# Patient Record
Sex: Female | Born: 1940 | Race: White | Hispanic: No | Marital: Married | Smoking: Never smoker
Health system: Southern US, Community
[De-identification: ages and names within clinical notes are randomized; demographics above are authoritative.]

## PROBLEM LIST (undated history)

## (undated) DIAGNOSIS — K5790 Diverticulosis of intestine, part unspecified, without perforation or abscess without bleeding: Secondary | ICD-10-CM

## (undated) DIAGNOSIS — M1990 Unspecified osteoarthritis, unspecified site: Secondary | ICD-10-CM

## (undated) DIAGNOSIS — I499 Cardiac arrhythmia, unspecified: Secondary | ICD-10-CM

## (undated) DIAGNOSIS — IMO0001 Reserved for inherently not codable concepts without codable children: Secondary | ICD-10-CM

## (undated) DIAGNOSIS — H9190 Unspecified hearing loss, unspecified ear: Secondary | ICD-10-CM

## (undated) DIAGNOSIS — K602 Anal fissure, unspecified: Secondary | ICD-10-CM

## (undated) DIAGNOSIS — Z9289 Personal history of other medical treatment: Secondary | ICD-10-CM

## (undated) DIAGNOSIS — R600 Localized edema: Secondary | ICD-10-CM

## (undated) DIAGNOSIS — D649 Anemia, unspecified: Secondary | ICD-10-CM

## (undated) DIAGNOSIS — I10 Essential (primary) hypertension: Secondary | ICD-10-CM

## (undated) DIAGNOSIS — E119 Type 2 diabetes mellitus without complications: Secondary | ICD-10-CM

## (undated) DIAGNOSIS — T884XXA Failed or difficult intubation, initial encounter: Secondary | ICD-10-CM

## (undated) DIAGNOSIS — E785 Hyperlipidemia, unspecified: Secondary | ICD-10-CM

## (undated) DIAGNOSIS — K7460 Unspecified cirrhosis of liver: Secondary | ICD-10-CM

## (undated) HISTORY — DX: Unspecified cirrhosis of liver: K74.60

## (undated) HISTORY — DX: Anal fissure, unspecified: K60.2

## (undated) HISTORY — PX: CATARACT EXTRACTION, BILATERAL: SHX1313

## (undated) HISTORY — PX: ABDOMINAL HYSTERECTOMY: SHX81

## (undated) HISTORY — PX: TOTAL HIP ARTHROPLASTY: SHX124

## (undated) HISTORY — PX: REPLACEMENT TOTAL KNEE: SUR1224

## (undated) HISTORY — DX: Failed or difficult intubation, initial encounter: T88.4XXA

## (undated) HISTORY — DX: Hyperlipidemia, unspecified: E78.5

## (undated) HISTORY — DX: Diverticulosis of intestine, part unspecified, without perforation or abscess without bleeding: K57.90

---

## 2014-05-01 ENCOUNTER — Encounter (HOSPITAL_COMMUNITY): Payer: Self-pay | Admitting: *Deleted

## 2014-05-01 ENCOUNTER — Inpatient Hospital Stay (HOSPITAL_COMMUNITY)
Admission: EM | Admit: 2014-05-01 | Discharge: 2014-05-01 | DRG: 812 | Disposition: A | Discharge status: Home / Self Care | Payer: Medicare Other | Source: Other Acute Inpatient Hospital | Attending: Internal Medicine | Admitting: Internal Medicine

## 2014-05-01 DIAGNOSIS — D649 Anemia, unspecified: Secondary | ICD-10-CM | POA: Diagnosis present

## 2014-05-01 DIAGNOSIS — N179 Acute kidney failure, unspecified: Secondary | ICD-10-CM | POA: Diagnosis present

## 2014-05-01 DIAGNOSIS — Z6838 Body mass index (BMI) 38.0-38.9, adult: Secondary | ICD-10-CM | POA: Diagnosis not present

## 2014-05-01 DIAGNOSIS — M4120 Other idiopathic scoliosis, site unspecified: Secondary | ICD-10-CM | POA: Diagnosis present

## 2014-05-01 DIAGNOSIS — D500 Iron deficiency anemia secondary to blood loss (chronic): Secondary | ICD-10-CM | POA: Diagnosis present

## 2014-05-01 DIAGNOSIS — K7460 Unspecified cirrhosis of liver: Secondary | ICD-10-CM | POA: Diagnosis present

## 2014-05-01 DIAGNOSIS — K219 Gastro-esophageal reflux disease without esophagitis: Secondary | ICD-10-CM | POA: Diagnosis present

## 2014-05-01 DIAGNOSIS — E669 Obesity, unspecified: Secondary | ICD-10-CM | POA: Diagnosis present

## 2014-05-01 DIAGNOSIS — E119 Type 2 diabetes mellitus without complications: Secondary | ICD-10-CM

## 2014-05-01 DIAGNOSIS — R0602 Shortness of breath: Secondary | ICD-10-CM | POA: Diagnosis present

## 2014-05-01 DIAGNOSIS — I10 Essential (primary) hypertension: Secondary | ICD-10-CM | POA: Diagnosis present

## 2014-05-01 DIAGNOSIS — E118 Type 2 diabetes mellitus with unspecified complications: Secondary | ICD-10-CM

## 2014-05-01 DIAGNOSIS — M069 Rheumatoid arthritis, unspecified: Secondary | ICD-10-CM | POA: Diagnosis present

## 2014-05-01 DIAGNOSIS — Z7982 Long term (current) use of aspirin: Secondary | ICD-10-CM | POA: Diagnosis not present

## 2014-05-01 DIAGNOSIS — D62 Acute posthemorrhagic anemia: Secondary | ICD-10-CM | POA: Diagnosis present

## 2014-05-01 DIAGNOSIS — R059 Cough, unspecified: Secondary | ICD-10-CM | POA: Diagnosis present

## 2014-05-01 DIAGNOSIS — R05 Cough: Secondary | ICD-10-CM | POA: Diagnosis present

## 2014-05-01 DIAGNOSIS — Z79899 Other long term (current) drug therapy: Secondary | ICD-10-CM

## 2014-05-01 HISTORY — DX: Reserved for inherently not codable concepts without codable children: IMO0001

## 2014-05-01 HISTORY — DX: Unspecified osteoarthritis, unspecified site: M19.90

## 2014-05-01 HISTORY — DX: Essential (primary) hypertension: I10

## 2014-05-01 HISTORY — DX: Cardiac arrhythmia, unspecified: I49.9

## 2014-05-01 HISTORY — DX: Anemia, unspecified: D64.9

## 2014-05-01 HISTORY — DX: Type 2 diabetes mellitus without complications: E11.9

## 2014-05-01 LAB — COMPREHENSIVE METABOLIC PANEL
ALT: 18 U/L (ref 0–35)
AST: 28 U/L (ref 0–37)
Albumin: 2.3 g/dL — ABNORMAL LOW (ref 3.5–5.2)
Alkaline Phosphatase: 159 U/L — ABNORMAL HIGH (ref 39–117)
Anion gap: 12 (ref 5–15)
BUN: 32 mg/dL — ABNORMAL HIGH (ref 6–23)
CALCIUM: 9 mg/dL (ref 8.4–10.5)
CO2: 21 mEq/L (ref 19–32)
CREATININE: 1.29 mg/dL — AB (ref 0.50–1.10)
Chloride: 104 mEq/L (ref 96–112)
GFR calc Af Amer: 46 mL/min — ABNORMAL LOW (ref >90)
GFR calc non Af Amer: 40 mL/min — ABNORMAL LOW (ref >90)
GLUCOSE: 168 mg/dL — AB (ref 70–99)
Potassium: 4.4 mEq/L (ref 3.7–5.3)
Sodium: 137 mEq/L (ref 137–147)
TOTAL PROTEIN: 6.1 g/dL (ref 6.0–8.3)
Total Bilirubin: 2.5 mg/dL — ABNORMAL HIGH (ref 0.3–1.2)

## 2014-05-01 LAB — APTT: APTT: 28 s (ref 24–37)

## 2014-05-01 LAB — CBC WITH DIFFERENTIAL/PLATELET
Basophils Absolute: 0 10*3/uL (ref 0.0–0.1)
Basophils Relative: 0 % (ref 0–1)
EOS ABS: 0 10*3/uL (ref 0.0–0.7)
EOS PCT: 0 % (ref 0–5)
HCT: 27.4 % — ABNORMAL LOW (ref 36.0–46.0)
HEMOGLOBIN: 9.1 g/dL — AB (ref 12.0–15.0)
LYMPHS ABS: 0.7 10*3/uL (ref 0.7–4.0)
Lymphocytes Relative: 18 % (ref 12–46)
MCH: 30.7 pg (ref 26.0–34.0)
MCHC: 33.2 g/dL (ref 30.0–36.0)
MCV: 92.6 fL (ref 78.0–100.0)
MONO ABS: 0.1 10*3/uL (ref 0.1–1.0)
MONOS PCT: 2 % — AB (ref 3–12)
Neutro Abs: 3.1 10*3/uL (ref 1.7–7.7)
Neutrophils Relative %: 80 % — ABNORMAL HIGH (ref 43–77)
Platelets: 116 10*3/uL — ABNORMAL LOW (ref 150–400)
RBC: 2.96 MIL/uL — AB (ref 3.87–5.11)
RDW: 17.6 % — ABNORMAL HIGH (ref 11.5–15.5)
WBC: 3.8 10*3/uL — ABNORMAL LOW (ref 4.0–10.5)

## 2014-05-01 LAB — TSH: TSH: 0.896 u[IU]/mL (ref 0.350–4.500)

## 2014-05-01 LAB — FERRITIN: Ferritin: 164 ng/mL (ref 10–291)

## 2014-05-01 LAB — IRON AND TIBC
Iron: 291 ug/dL — ABNORMAL HIGH (ref 42–135)
UIBC: <15 ug/dL — ABNORMAL LOW (ref 125–400)

## 2014-05-01 LAB — GLUCOSE, CAPILLARY
GLUCOSE-CAPILLARY: 171 mg/dL — AB (ref 70–99)
GLUCOSE-CAPILLARY: 166 mg/dL — AB (ref 70–99)
GLUCOSE-CAPILLARY: 151 mg/dL — AB (ref 70–99)

## 2014-05-01 LAB — PROTIME-INR
INR: 1.3 (ref 0.00–1.49)
PROTHROMBIN TIME: 16.3 s — AB (ref 11.6–15.2)

## 2014-05-01 LAB — VITAMIN B12: Vitamin B-12: 638 pg/mL (ref 211–911)

## 2014-05-01 LAB — FOLATE: Folate: >20 ng/mL

## 2014-05-01 MED ORDER — SODIUM CHLORIDE 0.9 % IJ SOLN
3.0000 mL | Freq: Two times a day (BID) | INTRAMUSCULAR | Status: DC
Start: 2014-05-01 — End: 2014-05-01
  Administered 2014-05-01: 3 mL via INTRAVENOUS

## 2014-05-01 MED ORDER — INSULIN ASPART 100 UNIT/ML ~~LOC~~ SOLN
0.0000 [IU] | Freq: Three times a day (TID) | SUBCUTANEOUS | Status: DC
  Administered 2014-05-01: 2 [IU] via SUBCUTANEOUS

## 2014-05-01 MED ORDER — ONDANSETRON HCL 4 MG/2ML IJ SOLN: 4.0000 mg | Freq: Four times a day (QID) | INTRAMUSCULAR | Status: DC | PRN

## 2014-05-01 MED ORDER — ONDANSETRON HCL 4 MG PO TABS: 4.0000 mg | ORAL_TABLET | Freq: Four times a day (QID) | ORAL | Status: DC | PRN

## 2014-05-01 MED ORDER — SIMVASTATIN 40 MG PO TABS
40.0000 mg | ORAL_TABLET | Freq: Every day | ORAL | Status: DC
  Administered 2014-05-01: 40 mg via ORAL
  Filled 2014-05-01: qty 1

## 2014-05-01 MED ORDER — FOLIC ACID 1 MG PO TABS
1.0000 mg | ORAL_TABLET | Freq: Every day | ORAL | Status: DC
Start: 2014-05-01 — End: 2014-05-01
  Administered 2014-05-01: 1 mg via ORAL
  Filled 2014-05-01: qty 1

## 2014-05-01 MED ORDER — ACETAMINOPHEN 650 MG RE SUPP: 650.0000 mg | Freq: Four times a day (QID) | RECTAL | Status: DC | PRN

## 2014-05-01 MED ORDER — ACETAMINOPHEN 325 MG PO TABS: 650.0000 mg | ORAL_TABLET | Freq: Four times a day (QID) | ORAL | Status: DC | PRN

## 2014-05-01 MED ORDER — PANTOPRAZOLE SODIUM 40 MG PO TBEC
40.0000 mg | DELAYED_RELEASE_TABLET | Freq: Two times a day (BID) | ORAL | Status: DC
  Administered 2014-05-01: 40 mg via ORAL
  Filled 2014-05-01: qty 1

## 2014-05-01 MED ORDER — BENZONATATE 100 MG PO CAPS
100.0000 mg | ORAL_CAPSULE | Freq: Three times a day (TID) | ORAL | Status: DC
  Administered 2014-05-01: 100 mg via ORAL
  Filled 2014-05-01: qty 1

## 2014-05-01 MED ORDER — INSULIN ASPART 100 UNIT/ML ~~LOC~~ SOLN: 0.0000 [IU] | Freq: Every day | SUBCUTANEOUS | Status: DC

## 2014-05-01 MED ORDER — MENTHOL 3 MG MT LOZG
1.0000 | LOZENGE | OROMUCOSAL | Status: DC | PRN
  Filled 2014-05-01: qty 9

## 2014-05-01 NOTE — H&P (Signed)
[facility] History and Physical  Patient: Lisa Burgess  Lisa Burgess:[id number]  DOB: 12-02-1940  DOS: the patient was seen and examined on 05/01/2014 PCP: Lisa Deem, MD  Chief Complaint: Shortness of breath and leg swelling  HPI: Lisa Burgess is a 73 y.o. female with Past medical history of hypertension, rheumatoid arthritis on methotrexate, diabetes mellitus. The patient is presenting with complaints of shortness of breath ongoing since last 4-6 weeks. Progressively worsening. She mentions her symptoms are worsening over last one week. She also complains of progressively worsening leg swelling. She denies any chest pain. She denies any nausea or vomiting. She is complaining of some acid reflux which is new for her. She denies any abdominal pain but complains of abdominal distention ongoing and she thinks this is a hernia. She denies any diarrhea or constipation. She denies any changes in her medication. She denies any fever or chills. She mentions she takes Tylenol on as needed basis  The patient is coming from home And at her baseline independent for most of her ADL.  Review of Systems: as mentioned in the history of present illness.  A Comprehensive review of the other systems is negative.  Past Medical History  Diagnosis Date  . Hypertension   . Dysrhythmia   . Shortness of breath dyspnea   . Diabetes mellitus without complication   . Arthritis   . Anemia    No past surgical history on file. Social History:  has no tobacco, alcohol, and drug history on file.  Allergies  Allergen Reactions  . Codeine Other (See Comments)    "makes me sick"  . Penicillins     No family history on file.  Prior to Admission medications   Medication Sig Start Date End Date Taking? Authorizing Provider  aspirin 81 MG EC tablet Take 81 mg by mouth daily. Swallow whole.   Yes Historical Provider, MD  chlorthalidone (HYGROTON) 50 MG tablet Take 50 mg by mouth daily.   Yes Historical  Provider, MD  diclofenac (VOLTAREN) 75 MG EC tablet Take 75 mg by mouth 2 (two) times daily.   Yes Historical Provider, MD  folic acid (FOLVITE) 1 MG tablet Take 1 mg by mouth daily.   Yes Historical Provider, MD  LISINOPRIL-HYDROCHLOROTHIAZIDE PO Take by mouth.   Yes Historical Provider, MD  metformin (FORTAMET) 1000 MG (OSM) 24 hr tablet Take 1,000 mg by mouth 2 (two) times daily with a meal.   Yes Historical Provider, MD  methotrexate (RHEUMATREX) 2.5 MG tablet Take 15 mg by mouth once a week. Caution:Chemotherapy. Protect from light.   Yes Historical Provider, MD  omeprazole (PRILOSEC) 20 MG capsule Take 20 mg by mouth daily before breakfast.   Yes Historical Provider, MD  potassium chloride (MICRO-K) 10 MEQ CR capsule Take 10 mEq by mouth daily.   Yes Historical Provider, MD  simvastatin (ZOCOR) 40 MG tablet Take 40 mg by mouth daily.   Yes Historical Provider, MD    Physical Exam: Filed Vitals:   05/01/14 0411  BP: 133/53  Pulse: 97  Temp: 99.1 F (37.3 C)  TempSrc: Oral  Resp: 17  Height: 5\' 3"  (1.6 m)  Weight: 99.338 kg (219 lb)  SpO2: 97%    General: Alert, Awake and Oriented to Time, Place and Person. Appear in mild distress Eyes: PERRL ENT: Oral Mucosa clear moist. Neck: no JVD Cardiovascular: S1 and S2 Present, aortic systolic Murmur, Peripheral Pulses Present Respiratory: Bilateral Air entry equal and Decreased, Clear to Auscultation, nopCrackles, no wheezes Abdomen: Bowel  Sound present and distended, Soft and non tender Skin: no Rash Extremities: no Pedal edema, no calf tenderness Neurologic: Grossly no focal neuro deficit.  Labs on Admission from the other facility:  CBC: WBC 3.5, hemoglobin 6.9, platelet 100  CMP  Creatinine 1BUN 35    Troponin negative  BNP (last 3 results) ProBNP mildly elevated  Radiological Exams on Admission: Chest x-ray clear, CT scan clear for any pulmonary embolism or pneumonia, CT abdomen pelvis shows ascites, cirrhosis,  portal hypertension  Assessment/Plan Principal Problem:   Symptomatic anemia Active Problems:   Diabetes mellitus, type 2   Essential hypertension, benign   Chronic rheumatic arthritis   GERD (gastroesophageal reflux disease)   Idiopathic scoliosis   1. Symptomatic anemia The patient is presenting with complaints of shortness of breath. She was found to be tachycardic. She does not have any active ongoing bleeding. Hemoccult is negative and the other facility. She had present disease and recommended. She has received 2 units of PRBCs 04. I would check her H&H and obtain further workup to diagnose and anemia. Probably multifactorial due to her rheumatoid arthritis, methotrexate, liver disease. May require GI consultation for further workup related to anemia.  2.cirrhosis of the liver. Patient mentions she has history of liver disease but does not know whether she has cirrhosis or not. At present she has cirrhosis and portal hypertension and ascites. We'll continue to closely monitor. Protonix twice a day. patient referring to follow-up with GI as an outpatient at [facility] or [facility] to transportation issues.  3.hypertension. Continuing home medications. Blood pressure is stable.  4.chronic rheumatoid arthritis. Continue Foley acid, hold methotrexate  5. Cough. CT scan is negative for any acute pneumonia. Probably sore throat.symptomatic treatment.  Advance goals of care discussion: full code   DVT Prophylaxis: mechanical compression device Nutrition: npo except medication  Disposition: Admitted to inpatient in telemetry  unit.  Author: Lynden Oxford, MD [facility] Pager: [phone] 05/01/2014, 5:33 AM    If 7PM-7AM, please contact night-coverage [email or web address] Password [email or web address]

## 2014-05-01 NOTE — Discharge Instructions (Signed)
Please be mindul of any dizziness or lightheadedness, especially when standing or sitting up, worsening weakness, dark black stool or bright red blood in stool  Anemia, Nonspecific Anemia is a condition in which the concentration of red blood cells or hemoglobin in the blood is below normal. Hemoglobin is a substance in red blood cells that carries oxygen to the tissues of the body. Anemia results in not enough oxygen reaching these tissues.  CAUSES  Common causes of anemia include:   Excessive bleeding. Bleeding may be internal or external. This includes excessive bleeding from periods (in women) or from the intestine.   Poor nutrition.   Chronic kidney, thyroid, and liver disease.  Bone marrow disorders that decrease red blood cell production.  Cancer and treatments for cancer.  HIV, AIDS, and their treatments.  Spleen problems that increase red blood cell destruction.  Blood disorders.  Excess destruction of red blood cells due to infection, medicines, and autoimmune disorders. SIGNS AND SYMPTOMS   Minor weakness.   Dizziness.   Headache.  Palpitations.   Shortness of breath, especially with exercise.   Paleness.  Cold sensitivity.  Indigestion.  Nausea.  Difficulty sleeping.  Difficulty concentrating. Symptoms may occur suddenly or they may develop slowly.  DIAGNOSIS  Additional blood tests are often needed. These help your health care provider determine the best treatment. Your health care provider will check your stool for blood and look for other causes of blood loss.  TREATMENT  Treatment varies depending on the cause of the anemia. Treatment can include:   Supplements of iron, vitamin B12, or folic acid.   Hormone medicines.   A blood transfusion. This may be needed if blood loss is severe.   Hospitalization. This may be needed if there is significant continual blood loss.   Dietary changes.  Spleen removal. HOME CARE  INSTRUCTIONS Keep all follow-up appointments. It often takes many weeks to correct anemia, and having your health care provider check on your condition and your response to treatment is very important. SEEK IMMEDIATE MEDICAL CARE IF:   You develop extreme weakness, shortness of breath, or chest pain.   You become dizzy or have trouble concentrating.  You develop heavy vaginal bleeding.   You develop a rash.   You have bloody or black, tarry stools.   You faint.   You vomit up blood.   You vomit repeatedly.   You have abdominal pain.  You have a fever or persistent symptoms for more than 2-3 days.   You have a fever and your symptoms suddenly get worse.   You are dehydrated.  MAKE SURE YOU:  Understand these instructions.  Will watch your condition.  Will get help right away if you are not doing well or get worse. Document Released: 07/13/2004 Document Revised: 02/05/2013 Document Reviewed: 11/29/2012 [facility] Patient Information 2015 [place], [place]. This information is not intended to replace advice given to you by your health care provider. Make sure you discuss any questions you have with your health care provider.

## 2014-05-01 NOTE — Progress Notes (Signed)
Patient Discharge:  Disposition: Pt discharged home  Education: Pt educated on follow up appointments and all discharge instructions. Pt verbalized understanding.  IV: Removed  Telemetry: Removed. Central Telemetry notified  Follow-up appointments:Reviewed with Pt. Verbalized understanding.  Prescriptions: N/A  Transportation: Transported home by family  Belongings:All belongings taken with pt.

## 2014-05-01 NOTE — Progress Notes (Signed)
   Follow Up Note  Pt admitted earlier this morning.  Seen after arrived to floor.  73 year old female with past medical history of diabetes mellitus and hypertension plus a chronic anemia with a hemoglobin around 9 who has had previous workup that has been unrevealing and the possibility of AVMs noted on colonoscopy last year who was admitted earlier this morning (after being transferred from an outside hospital)  For several days of increasing shortness of breath and found to have neck hemoglobin of 6.9 and acute renal failure.  Prior to transfer, patient received 2 units packed red blood cells and labs upon admission noted a creatinine of 1.29 and a hemoglobin of 9.1 with a normal MCV.  Patient herself feeling better. Has noted no change in her stools in the last few months. Her only complaint is of a cough that has been going on every winter, nonproductive.  Exam: WH:QPRFFMB rate and rhythm, S1-S2 Lungs:clear to auscultation bilaterally Abd: soft, obese, nontender, positive bowel sounds Ext: clubbing or cyanosis, trace edema  Present on Admission:  . Essential hypertension, benign: stable . Chronic rheumatic arthritis:on methotrexate . GERD (gastroesophageal reflux disease): On PPI . Idiopathic scoliosis . Symptomatic anemia . ARF (acute renal failure): creatinine is higher than patient's baseline, even posttransfusion, indicative of an acute process likely GI bleed . Acute blood loss anemia: Suspect from blood loss. GI to see. If unable to scope tomorrow, could . 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

## 2014-05-01 NOTE — Plan of Care (Signed)
Problem: Phase I Progression Outcomes Goal: OOB as tolerated unless otherwise ordered Outcome: Progressing     

## 2014-05-01 NOTE — Discharge Summary (Signed)
Discharge Summary  Lisa Burgess EAV:[id number] DOB: [birth year]  PCP: Alinda Deem, MD  Admit date: 05/01/2014 Discharge date: 05/01/2014  Time spent: 45 minutes  Recommendations for Outpatient Follow-up:  1. Patient will follow-up with her primary care physician on Monday 11/16. At that time, repeat hemoglobin can be checked, to ensure that she does not have further blood loss. Referral can also be made for outpatient GI follow-up  Discharge Diagnoses:  Active Hospital Problems   Diagnosis Date Noted  . Acute blood loss anemia 05/01/2014  . Diabetes mellitus, type 2 05/01/2014  . Essential hypertension, benign 05/01/2014  . Chronic rheumatic arthritis 05/01/2014  . GERD (gastroesophageal reflux disease) 05/01/2014  . Idiopathic scoliosis 05/01/2014  . Symptomatic anemia 05/01/2014  . ARF (acute renal failure) 05/01/2014  . Anemia due to chronic blood loss 05/01/2014  . Cough 05/01/2014  . Obesity (BMI 30-39.9) 05/01/2014    Resolved Hospital Problems   Diagnosis Date Noted Date Resolved  No resolved problems to display.    Discharge Condition: improved, being discharged home  Diet recommendation: carb modified  Filed Weights   05/01/14 0411  Weight: 99.338 kg (219 lb)    History of present illness:   73 year old female with past medical history of diabetes mellitus and hypertension plus a chronic anemia with a hemoglobin around 9 who has had previous workup that has been unrevealing and the possibility of AVMs noted on colonoscopy last year who was admitted earlier this morning (after being transferred from an outside hospital) For several days of increasing shortness of breath and found to have neck hemoglobin of 6.9 and acute renal failure. Prior to transfer, patient received 2 units packed red blood cells and labs upon admission noted a creatinine of 1.29 and a hemoglobin of 9.1 with a normal MCV.  Hospital Course:  Principal Problem:   Symptomatic  anemia/possible acute blood loss anemia in the setting of chronic blood loss anemia: Status post 2 units packed red blood cells which improved patient's hemoglobin from 6.9-9.1. Seen by gastroenterology who felt her hemoglobin may be related to chronic blood loss issues. They felt that she was stable and did not need further monitoring on acute scope. Rectal patient does feel more comfortable she was discharged tomorrow check a follow-up hemoglobin 24 hours later to ensure she did not have a drop, however the patient and family declined this. Given instructions for monitor for change in stool habits such as bright red blood or black tarry stools, dizziness, pallor or tachycardia and if these things were to occur to return back to the emergency room. They said they would do so. If stable, patient will follow up with her PCP on Monday and have a repeat hemoglobin checked at that time. At the time also referral can be made for new GI follow-up near her area. Her gastroenterologist who she saw for her previous chronic anemia had retired. Active Problems:   Diabetes mellitus, type 2: CBGs remain stable, continue home meds   Essential hypertension, benign: Continue home meds   Chronic rheumatic arthritis: Continue methotrexate   GERD (gastroesophageal reflux disease): Continue PPI   Idiopathic scoliosis    ARF (acute renal failure): My concerns for acute bleeding given him than her creatinine. Confirmed with PCP her baseline is 1.1 and following blood transfusion, creatinine had come down to 1.29. This should improve    Cough: More than shortness of breath, this was patient's main complaint. She states that she gets this every winter. Nonproductive. Lung exam clear. Although she  is on ACE inhibitor and methotrexate, both of these would not be very seasonal. Did check BNP which at time of this dictation is pending.   Obesity (BMI 30-39.9):patient meets criteria with BMI greater than 30 Fatty liver: Patient  reports liver disease, but this is not cirrhosis as confirmed by her primary care physician   Procedures:  Status post 2 units packed red blood cell transfusion prior to admission from outside hospital  Consultations:  [place] [facility]  Discharge Exam: BP 124/60 mmHg  Pulse 89  Temp(Src) 98.4 F (36.9 C) (Oral)  Resp 18  Ht 5\' 3"  (1.6 m)  Wt 99.338 kg (219 lb)  BMI 38.80 kg/m2  SpO2 98%  General: alert and oriented 3, no acute distress Cardiovascular: regular rate and rhythm, S1-S2, 2/6 systolic ejection murmur Respiratory: clear to auscultation bilaterally  Discharge Instructions You were cared for by a hospitalist during your hospital stay. If you have any questions about your discharge medications or the care you received while you were in the hospital after you are discharged, you can call the unit and asked to speak with the hospitalist on call if the hospitalist that took care of you is not available. Once you are discharged, your primary care physician will handle any further medical issues. Please note that NO REFILLS for any discharge medications will be authorized once you are discharged, as it is imperative that you return to your primary care physician (or establish a relationship with a primary care physician if you do not have one) for your aftercare needs so that they can reassess your need for medications and monitor your lab values.  Discharge Instructions    Diet - low sodium heart healthy    Complete by:  As directed      Increase activity slowly    Complete by:  As directed             Medication List    TAKE these medications        acetaminophen 500 MG tablet  Commonly known as:  TYLENOL  Take 500 mg by mouth daily as needed for headache (pain).     aspirin 81 MG EC tablet  Take 81 mg by mouth at bedtime. Swallow whole.     atenolol 50 MG tablet  Commonly known as:  TENORMIN  Take 25 mg by mouth daily.     chlorthalidone 50 MG tablet   Commonly known as:  HYGROTON  Take 50 mg by mouth daily.     diclofenac 75 MG EC tablet  Commonly known as:  VOLTAREN  Take 75 mg by mouth 2 (two) times daily.     folic acid 1 MG tablet  Commonly known as:  FOLVITE  Take 1 mg by mouth at bedtime.     lisinopril-hydrochlorothiazide 10-12.5 MG per tablet  Commonly known as:  PRINZIDE,ZESTORETIC  Take 0.5 tablets by mouth daily.     metFORMIN 1000 MG tablet  Commonly known as:  GLUCOPHAGE  Take 1,000 mg by mouth 2 (two) times daily with a meal.     methotrexate 2.5 MG tablet  Commonly known as:  RHEUMATREX  Take 15 mg by mouth once a week. Caution:Chemotherapy. Protect from light. Take every Monday night     potassium chloride 10 MEQ CR capsule  Commonly known as:  MICRO-K  Take 10 mEq by mouth daily.     PRILOSEC 20 MG capsule  Generic drug:  omeprazole  Take 20 mg by mouth daily before breakfast.  simvastatin 40 MG tablet  Commonly known as:  ZOCOR  Take 40 mg by mouth at bedtime.     SYSTANE OP  Place 1 drop into both eyes daily as needed (dry eyes).     Vitamin D (Ergocalciferol) 50000 UNITS Caps capsule  Commonly known as:  DRISDOL  Take 50,000 Units by mouth every 7 (seven) days. On Mondays       Allergies  Allergen Reactions  . Codeine Nausea And Vomiting  . Penicillins Other (See Comments)    Unknown allergic reaction       Follow-up Information    Follow up with Alinda Deem, MD On 05/04/2014.   Specialty:  Family Medicine   Why:  Repeat blood level on Monday 11/16   Contact information:   [address] [place] [address] [phone]        The results of significant diagnostics from this hospitalization (including imaging, microbiology, ancillary and laboratory) are listed below for reference.    Significant Diagnostic Studies: No results found.  Microbiology: No results found for this or any previous visit (from the past 240 hour(s)).   Labs: Basic Metabolic  Panel:  Recent Labs Lab 05/01/14 0659  NA 137  K 4.4  CL 104  CO2 21  GLUCOSE 168*  BUN 32*  CREATININE 1.29*  CALCIUM 9.0   Liver Function Tests:  Recent Labs Lab 05/01/14 0659  AST 28  ALT 18  ALKPHOS 159*  BILITOT 2.5*  PROT 6.1  ALBUMIN 2.3*   No results for input(s): LIPASE, AMYLASE in the last 168 hours. No results for input(s): AMMONIA in the last 168 hours. CBC:  Recent Labs Lab 05/01/14 0659  WBC 3.8*  NEUTROABS 3.1  HGB 9.1*  HCT 27.4*  MCV 92.6  PLT 116*   Cardiac Enzymes: No results for input(s): CKTOTAL, CKMB, CKMBINDEX, TROPONINI in the last 168 hours. BNP: BNP (last 3 results) No results for input(s): PROBNP in the last 8760 hours. CBG:  Recent Labs Lab 05/01/14 0501 05/01/14 0751 05/01/14 1317  GLUCAP 171* 166* 151*       Signed:  Tishana Clinkenbeard K  [facility] 05/01/2014, 6:13 PM

## 2014-05-01 NOTE — Progress Notes (Signed)
New Admission Note:   Arrival Method: Stretcher via transport from [facility] Mental Orientation: alert and oriented x4 Telemetry: N/A Assessment: Completed Skin: Intact IV: L AC and R AC Pain: N/A Tubes: N/A Safety Measures: Safety Fall Prevention Plan has been given, discussed Admission: Completed [address] Orientation: Patient has been orientated to the room, unit and staff.  Family: N/A  Orders have been reviewed and implemented. Will continue to monitor the patient. Call light has been placed within reach and bed alarm has been activated.   [employer], RN-BC Phone number: [phone]

## 2014-05-01 NOTE — Consult Note (Signed)
Consultation  Referring Provider:  [facility]    Primary Care Physician:  Alinda Deem, MD Primary Gastroenterologist: out of state         Reason for Consultation: anemia             HPI:   Lisa Burgess is a 73 y.o. female transferred from [place], [place] for symptomatic anemia. Apparently there was no GI coverage at the hospital. Patient is established with Dr. Aleene Davidson in [place] but he recently retired. She has a history of anemia which was evaluated by EGD, colonoscopy and capsule endo approximately two years ago. 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 Her hgb was apparently in 9 range during that time. Patient has been having SOB over last several weeks. She went to ED in [place], [place] where CXR and CTscan negative for PE or PNA. She did have findings of cirrhosis / ascites on CTscan. Hgb was in 6 range. Patient was transfused two units of blood and transferred to [facility]. Her hgb is now back in 9 range. She has occasional rectal bleeding when she wipes but this has been going on for years. No black stools. No abdominal pain. No nausea or unexpected weight loss. Cirrhosis is new diagnosis for her. She has been on Methotrexate for years. No fmh of liver disease.    Past Medical History  Diagnosis Date  . Hypertension   . Dysrhythmia   . Diabetes mellitus without complication   . Arthritis   . Anemia     Past Surgical History  Procedure Laterality Date  . Joint replacement      FMH: no colon cancer. No liver disease  History  Substance Use Topics  . Smoking status: Never Smoker   . Smokeless tobacco: Not on file  . Alcohol Use: No    Prior to Admission medications   Medication Sig Start Date End Date Taking? Authorizing Provider  aspirin 81 MG EC tablet Take 81 mg by mouth daily. Swallow whole.   Yes Historical Provider, MD  chlorthalidone (HYGROTON) 50 MG tablet Take 50 mg by mouth daily.   Yes Historical Provider, MD  diclofenac  (VOLTAREN) 75 MG EC tablet Take 75 mg by mouth 2 (two) times daily.   Yes Historical Provider, MD  folic acid (FOLVITE) 1 MG tablet Take 1 mg by mouth daily.   Yes Historical Provider, MD  LISINOPRIL-HYDROCHLOROTHIAZIDE PO Take by mouth.   Yes Historical Provider, MD  metformin (FORTAMET) 1000 MG (OSM) 24 hr tablet Take 1,000 mg by mouth 2 (two) times daily with a meal.   Yes Historical Provider, MD  methotrexate (RHEUMATREX) 2.5 MG tablet Take 15 mg by mouth once a week. Caution:Chemotherapy. Protect from light.   Yes Historical Provider, MD  omeprazole (PRILOSEC) 20 MG capsule Take 20 mg by mouth daily before breakfast.   Yes Historical Provider, MD  potassium chloride (MICRO-K) 10 MEQ CR capsule Take 10 mEq by mouth daily.   Yes Historical Provider, MD  simvastatin (ZOCOR) 40 MG tablet Take 40 mg by mouth daily.   Yes Historical Provider, MD    Current Facility-Administered Medications  Medication Dose Route Frequency Provider Last Rate Last Dose  . acetaminophen (TYLENOL) tablet 650 mg  650 mg Oral Q6H PRN Lynden Oxford, MD       Or  . acetaminophen (TYLENOL) suppository 650 mg  650 mg Rectal Q6H PRN Lynden Oxford, MD      . benzonatate (TESSALON) capsule 100 mg  100  mg Oral TID Lynden Oxford, MD   100 mg at 05/01/14 1013  . folic acid (FOLVITE) tablet 1 mg  1 mg Oral Daily Lynden Oxford, MD   1 mg at 05/01/14 1013  . insulin aspart (novoLOG) injection 0-5 Units  0-5 Units Subcutaneous QHS Lynden Oxford, MD      . insulin aspart (novoLOG) injection 0-9 Units  0-9 Units Subcutaneous TID WC Lynden Oxford, MD   2 Units at 05/01/14 1346  . menthol-cetylpyridinium (CEPACOL) lozenge 3 mg  1 lozenge Oral PRN Lynden Oxford, MD      . ondansetron (ZOFRAN) tablet 4 mg  4 mg Oral Q6H PRN Lynden Oxford, MD       Or  . ondansetron (ZOFRAN) injection 4 mg  4 mg Intravenous Q6H PRN Lynden Oxford, MD      . pantoprazole (PROTONIX) EC tablet 40 mg  40 mg Oral BID AC Lynden Oxford, MD   40 mg at 05/01/14 1013  .  simvastatin (ZOCOR) tablet 40 mg  40 mg Oral Daily Lynden Oxford, MD   40 mg at 05/01/14 1013  . sodium chloride 0.9 % injection 3 mL  3 mL Intravenous Q12H Lynden Oxford, MD   3 mL at 05/01/14 1013    Allergies as of 04/30/2014  . (Not on File)    Review of Systems:    Lower extremity swelling. All other systems reviewed and negative except where noted in HPI.    Physical Exam:  Vital signs in last 24 hours: Temp:  [99.1 F (37.3 C)] 99.1 F (37.3 C) (11/13 0411) Pulse Rate:  [97] 97 (11/13 0411) Resp:  [17] 17 (11/13 0411) BP: (133)/(53) 133/53 mmHg (11/13 0411) SpO2:  [97 %] 97 % (11/13 0411) Weight:  [219 lb (99.338 kg)] 219 lb (99.338 kg) (11/13 0411) Last BM Date: 04/30/14 General:   Pleasant, morbidly obese white female in NAD Head:  Normocephalic and atraumatic. Eyes:   No icterus.   Conjunctiva pink. Ears:  Normal auditory acuity. Neck:  Supple; no masses felt Lungs:  Respirations even and unlabored. Lungs clear to auscultation bilaterally. Wheezes when lying flat.    Heart:  Regular rate and rhythm Abdomen:  Soft, obese, nondistended, nontender. Normal bowel sounds, ? Diastasis recti.  Rectal:  Not performed. Heme Negative in [place] Msk:  Symmetrical without gross deformities.  Extremities:  Without edema. Neurologic:  Alert and  oriented x4;  grossly normal neurologically. Skin:  Intact without significant lesions or rashes. Cervical Nodes:  No significant cervical adenopathy. Psych:  Alert and cooperative. Normal affect.  LAB RESULTS:  Recent Labs  05/01/14 0659  WBC 3.8*  HGB 9.1*  HCT 27.4*  PLT 116*   BMET  Recent Labs  05/01/14 0659  NA 137  K 4.4  CL 104  CO2 21  GLUCOSE 168*  BUN 32*  CREATININE 1.29*  CALCIUM 9.0   LFT  Recent Labs  05/01/14 0659  PROT 6.1  ALBUMIN 2.3*  AST 28  ALT 18  ALKPHOS 159*  BILITOT 2.5*   PT/INR  Recent Labs  05/01/14 0659  LABPROT 16.3*  INR 1.30    PREVIOUS ENDOSCOPIES:            No  available records but EGD, Colonoscopy and endo capsule in [place]   Impression / Plan:   65. 73 year old female transferred from [place], [place] with symptomatic anemia / heme negative stool. No records but patient describes anemia workup in [place] approx. 2 years ago. 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 Patient has had an acute drop in hgb from baseline of around 9 to 6 for unclear reasons but she is heme negative. Anemia may be due in part to cirrhosis.   No plans to repeat endoscopic workup at this time. '  Her gastroenterologist in [place] has retired. She lives closer to [place] than [place] and want to establish GI care there. I recommended she take all GI records with her at time of new patient visit.   Can be discharged from GI standpoint  2. Cirrhosis, newly diagnosed. She does have ascites on CTScan but wonder if some of that is heart failure. No other signs of liver decompensate. Etiology of cirrhosis may be Methotrexate and / or NASH.   3. RA, on chronic methotrexate.   4. Diabetes / HTN / obesity  Thanks   LOS: 0 days   Willette Cluster  05/01/2014, 2:12 PM    ________________________________________________________________________  [facility] MD note:  I personally examined the patient, reviewed the data and agree with the assessment and plan described above.  She is not overtly bleeding.  I suspect her anemia is related to cirrhosis (?portal gastropathy).  She has had an appropriate bump in Hb and is OK to d/c home today.  She needs to establish as a new patient and I offered my services however she lives in [place] and does not want to travel to [facility] for office visits which I understand.  She will call on GI in [place], [place] instead.    Please call or page with any further questions or concerns.    Rob Bunting, MD [facility] Pager [phone]

## 2014-05-01 NOTE — Progress Notes (Signed)
Utilization review completed.  

## 2014-05-01 NOTE — Progress Notes (Signed)
05/01/14 0411 Received patient from [facility] via transport.Patient alert and oriented x4.Patient is currently receiving PRBC unit #[id number],Rh O Positive,expiration date 11 May 2014.Blood transfusion was started at [facility].Patient is tolerating it well. 04:50 Blood transfusion complete.Patient tolerated it well. Ronya Gilcrest, Drinda Butts, Charity fundraiser

## 2014-05-19 ENCOUNTER — Encounter: Payer: Self-pay | Admitting: Gastroenterology

## 2014-05-25 ENCOUNTER — Encounter: Payer: Self-pay | Admitting: Gastroenterology

## 2014-05-25 ENCOUNTER — Other Ambulatory Visit (INDEPENDENT_AMBULATORY_CARE_PROVIDER_SITE_OTHER): Payer: Medicare Other

## 2014-05-25 ENCOUNTER — Ambulatory Visit (INDEPENDENT_AMBULATORY_CARE_PROVIDER_SITE_OTHER): Payer: Medicare Other | Admitting: Gastroenterology

## 2014-05-25 VITALS — BP 110/60 | HR 76 | Ht 63.0 in | Wt 220.2 lb

## 2014-05-25 DIAGNOSIS — K7460 Unspecified cirrhosis of liver: Secondary | ICD-10-CM | POA: Diagnosis not present

## 2014-05-25 LAB — IBC PANEL
Iron: 35 ug/dL — ABNORMAL LOW (ref 42–145)
SATURATION RATIOS: 15.2 % — AB (ref 20.0–50.0)
Transferrin: 164 mg/dL — ABNORMAL LOW (ref 212.0–360.0)

## 2014-05-25 LAB — COMPREHENSIVE METABOLIC PANEL
ALK PHOS: 152 U/L — AB (ref 39–117)
ALT: 24 U/L (ref 0–35)
AST: 22 U/L (ref 0–37)
Albumin: 2.2 g/dL — ABNORMAL LOW (ref 3.5–5.2)
BILIRUBIN TOTAL: 1.4 mg/dL — AB (ref 0.2–1.2)
BUN: 44 mg/dL — AB (ref 6–23)
CALCIUM: 8.5 mg/dL (ref 8.4–10.5)
CHLORIDE: 109 meq/L (ref 96–112)
CO2: 22 mEq/L (ref 19–32)
CREATININE: 1.9 mg/dL — AB (ref 0.4–1.2)
GFR: 27.63 mL/min — ABNORMAL LOW (ref >60.00)
Glucose, Bld: 130 mg/dL — ABNORMAL HIGH (ref 70–99)
Potassium: 4.2 mEq/L (ref 3.5–5.1)
Sodium: 138 mEq/L (ref 135–145)
Total Protein: 5.6 g/dL — ABNORMAL LOW (ref 6.0–8.3)

## 2014-05-25 LAB — CBC WITH DIFFERENTIAL/PLATELET
BASOS ABS: 0 10*3/uL (ref 0.0–0.1)
BASOS PCT: 0 % (ref 0.0–3.0)
Eosinophils Absolute: 0 10*3/uL (ref 0.0–0.7)
Eosinophils Relative: 0.4 % (ref 0.0–5.0)
HCT: 25.9 % — ABNORMAL LOW (ref 36.0–46.0)
LYMPHS ABS: 1.1 10*3/uL (ref 0.7–4.0)
Lymphocytes Relative: 12.4 % (ref 12.0–46.0)
MCHC: 32.8 g/dL (ref 30.0–36.0)
MCV: 93.4 fl (ref 78.0–100.0)
MONO ABS: 0.1 10*3/uL (ref 0.1–1.0)
Monocytes Relative: 0.6 % — ABNORMAL LOW (ref 3.0–12.0)
NEUTROS ABS: 7.5 10*3/uL (ref 1.4–7.7)
Neutrophils Relative %: 86.6 % — ABNORMAL HIGH (ref 43.0–77.0)
Platelets: 115 10*3/uL — ABNORMAL LOW (ref 150.0–400.0)
RBC: 2.77 Mil/uL — AB (ref 3.87–5.11)
RDW: 19.4 % — ABNORMAL HIGH (ref 11.5–15.5)
WBC: 8.7 10*3/uL (ref 4.0–10.5)

## 2014-05-25 LAB — PROTIME-INR
INR: 1.4 ratio — ABNORMAL HIGH (ref 0.8–1.0)
PROTHROMBIN TIME: 15.6 s — AB (ref 9.6–13.1)

## 2014-05-25 LAB — FERRITIN: Ferritin: 296.1 ng/mL — ABNORMAL HIGH (ref 10.0–291.0)

## 2014-05-25 MED ORDER — SPIRONOLACTONE 50 MG PO TABS: 100.0000 mg | ORAL_TABLET | Freq: Every day | ORAL | Status: DC

## 2014-05-25 MED ORDER — HYDROCORTISONE ACE-PRAMOXINE 2.5-1 % RE CREA: 1.0000 "application " | TOPICAL_CREAM | Freq: Two times a day (BID) | RECTAL | Status: AC | PRN

## 2014-05-25 MED ORDER — FUROSEMIDE 20 MG PO TABS: 40.0000 mg | ORAL_TABLET | Freq: Every day | ORAL | Status: AC

## 2014-05-25 NOTE — Patient Instructions (Addendum)
You will get labs drawn today:  Hepatitis A (IgM and IgG), Hepatitis B surface antigen, Hepatitis B surface antibody, Hepatitis C antibody, total iron, ferritin, TIBC, ANA, AMA, alphafeto protein (AFP), anti smooth muscle antibody, CBC, CMET, INR. You will be set up for an ultrasound for elevated liver tests, cirrhosis (screening for hepatoma).  You have been scheduled for an abdominal ultrasound at [facility] (1st floor of hospital) on 05/29/14 at 830 am. Please arrive 15 minutes prior to your appointment for registration. Make certain not to have anything to eat or drink 6 hours prior to your appointment. Should you need to reschedule your appointment, please contact radiology at [phone]. This test typically takes about 30 minutes to perform. We will get records sent from your previous gastroenterologist in [place] for review.  This will include any endoscopic (colonoscopy, upper endoscopy, capsule endoscopy) procedures and any associated pathology reports. You will be set up for an upper endoscopy (screening  For varices, at [facility] hosp with MAC sedation). It is important that you have a relatively low salt diet.  High salt diet can cause fluid to accumulate in your legs, abdomen and even around your lungs. You should try to avoid NSAID type over the counter pain medicines as best as possible. Tylenol is safe to take for 'routine' aches and pains, but never take more than 1/2 the dose suggested on the package instructions (never more than 2 grams per day). Continue to avoid alcohol. Increase your aldactone to 100mg  once daily, new script written. Restart lasix at 40mg  once daily, new script written. Labs in 1 week (BMET), can be done locally with results faxed here. Please return to see Dr. on 06/29/14 2:15 pm Sitz baths twice daily.  Apply prescription cream to hemorrhoid after sitz bath until hemorrhoid improved.

## 2014-05-25 NOTE — Progress Notes (Signed)
HPI: This is a  very pleasant 73 year old woman who is here in a wheelchair today. She is here with her husband and her daughter.  I met her when she was hospitalized at [facility] after being transferred from [facility] emergency room with anemia. Her hemoglobin was around 6. She was Hemoccult negative and had had no overt GI bleeding. Imaging, done in [place] only, suggested she had cirrhosis, ascites. She tells me she has been seeing a gastroenterologist up there but he has since retired. She believes she had upper endoscopy, colonoscopy, capsule endoscopy all within the last 2 or 3 years. There was no active or even microscopic bleeding when she was in the hospital and I recommended blood transfusion. At that point she was not interested in having her care in [place] since she lives in [place]. She was planning to establish care a bit closer to home.   She thinks colonoscopy an EGD were about 2 years.  Never had polyps.  No colon cancer in the family.  Has been on methotrexate for many years for rheum arthritis.  Never etoh.  Not sure if she was tested for hepatitis virus.  She has a painful hemorrhoid, for the past week or two.  May have increased abd girth.  May watch her salt.  Was on lasix, this was stopped recently and aldactone was started.  Takes motrin periodically.  Tends to have loose stools, sore anus.    Past Medical History  Diagnosis Date  . Hypertension   . Dysrhythmia   . Shortness of breath dyspnea   . Diabetes mellitus without complication   . Arthritis   . Anemia   . Anal fissure   . Diverticulosis   . HLD (hyperlipidemia)     Past Surgical History  Procedure Laterality Date  . Total hip arthroplasty Bilateral   . Replacement total knee Left   . Abdominal hysterectomy      Current Outpatient Prescriptions  Medication Sig Dispense Refill  . atenolol (TENORMIN) 50 MG tablet Take 25 mg by mouth daily.    . ferrous sulfate 325 (65  FE) MG EC tablet Take 325 mg by mouth daily.    . folic acid (FOLVITE) 1 MG tablet Take 1 mg by mouth at bedtime.     Marland Kitchen lisinopril-hydrochlorothiazide (PRINZIDE,ZESTORETIC) 10-12.5 MG per tablet Take 0.5 tablets by mouth daily.    . metFORMIN (GLUCOPHAGE) 1000 MG tablet Take 1,000 mg by mouth 2 (two) times daily with a meal.    . omeprazole (PRILOSEC) 20 MG capsule Take 20 mg by mouth daily before breakfast.    . Polyethyl Glycol-Propyl Glycol (SYSTANE OP) Place 1 drop into both eyes daily as needed (dry eyes).    Marland Kitchen spironolactone (ALDACTONE) 50 MG tablet Take 50 mg by mouth daily.    . Vitamin D, Ergocalciferol, (DRISDOL) 50000 UNITS CAPS capsule Take 50,000 Units by mouth every 7 (seven) days. On Mondays     No current facility-administered medications for this visit.    Allergies as of 05/25/2014 - Review Complete 05/25/2014  Allergen Reaction Noted  . Codeine Nausea And Vomiting 05/01/2014  . Penicillins Other (See Comments) 05/01/2014    Family History  Problem Relation Age of Onset  . Breast cancer Mother   . Stroke Maternal Grandfather   . Heart failure Father     CHF  . Emphysema Father     History   Social History  . Marital Status: Married    Spouse Name: N/A  Number of Children: 1  . Years of Education: N/A   Occupational History  . retired    Social History Main Topics  . Smoking status: Never Smoker   . Smokeless tobacco: Never Used  . Alcohol Use: No  . Drug Use: No  . Sexual Activity: Not on file   Other Topics Concern  . Not on file   Social History Narrative      Physical Exam: Ht _0  (1.6 m)  Wt 220 lb 4 oz (99.905 kg)  BMI 39.03 kg/m2 Constitutional: generally well-appearing No asterixis No scleral icterus Psychiatric: alert and oriented x3 Abdomen: soft, nontender, nondistended, likely moderate ascites, no peritoneal signs, normal bowel sounds 1-2+ lower extremity edema bilaterally Rectal examination with female assistant in the  room revealed small to medium sized external hemorrhoids, slightly thrombosed    Assessment and plan: 73 y.o. female with recent diagnosis cirrhosis, hemorrhoid, anemia  She is going to have a battery of blood tests today to try to determine the etiology of her cirrhosis. Most likely this is from long-standing methotrexate use. She needs an ultrasound for hepatoma screening. She understands she will need this about every 6 months as well. Her fluid status is not good control. She is on single diuretic only. She is going to increase the Aldactone to 100 mg once daily and she will restart her Lasix at 40 mg once daily. She will have a repeat set of CBC, quit about profile, coags today. She will also have a repeat basic metabolic profile in a week or so. This can be done locally and faxed results here. I prescribed her Analpram ointment and advised sitz baths twice daily for her external anal hemorrhoid. She will return to see me in about one month's time and sooner if needed.  She needs an upper endoscopy to screen for varices, portal gastropathy. This is best to be done at the hospital with anesthesia assistance for sedation.

## 2014-05-26 LAB — AFP TUMOR MARKER: AFP-Tumor Marker: 3 ng/mL (ref <6.1)

## 2014-05-26 LAB — HEPATITIS A ANTIBODY, TOTAL: Hep A Total Ab: NONREACTIVE

## 2014-05-26 LAB — HEPATITIS C ANTIBODY: HCV Ab: NEGATIVE

## 2014-05-26 LAB — HEPATITIS B SURFACE ANTIGEN: Hepatitis B Surface Ag: NEGATIVE

## 2014-05-26 LAB — HEPATITIS B SURFACE ANTIBODY,QUALITATIVE: HEP B S AB: NEGATIVE

## 2014-05-27 LAB — ANTI-NUCLEAR AB-TITER (ANA TITER)

## 2014-05-27 LAB — MITOCHONDRIAL ANTIBODIES: Mitochondrial M2 Ab, IgG: 0.48 (ref <0.91)

## 2014-05-27 LAB — ANTI-SMOOTH MUSCLE ANTIBODY, IGG: Smooth Muscle Ab: 10 U (ref <20)

## 2014-05-27 LAB — ANA: Anti Nuclear Antibody(ANA): POSITIVE — AB

## 2014-05-29 ENCOUNTER — Ambulatory Visit (HOSPITAL_COMMUNITY)
Admission: RE | Admit: 2014-05-29 | Discharge: 2014-05-29 | Disposition: A | Discharge status: Home / Self Care | Payer: Medicare Other | Source: Ambulatory Visit | Attending: Gastroenterology | Admitting: Gastroenterology

## 2014-05-29 DIAGNOSIS — K7460 Unspecified cirrhosis of liver: Secondary | ICD-10-CM

## 2014-05-29 DIAGNOSIS — R945 Abnormal results of liver function studies: Secondary | ICD-10-CM | POA: Insufficient documentation

## 2014-05-29 DIAGNOSIS — R188 Other ascites: Secondary | ICD-10-CM | POA: Insufficient documentation

## 2014-05-29 DIAGNOSIS — R161 Splenomegaly, not elsewhere classified: Secondary | ICD-10-CM | POA: Diagnosis not present

## 2014-05-30 ENCOUNTER — Encounter: Payer: Self-pay | Admitting: Gastroenterology

## 2014-06-02 ENCOUNTER — Telehealth: Payer: Self-pay | Admitting: Gastroenterology

## 2014-06-02 DIAGNOSIS — D509 Iron deficiency anemia, unspecified: Secondary | ICD-10-CM

## 2014-06-02 NOTE — Telephone Encounter (Signed)
The pt will call with the fax number to send order for labs she is aware of the med changes.

## 2014-06-02 NOTE — Telephone Encounter (Signed)
Order was faxed to the [place] office

## 2014-06-02 NOTE — Telephone Encounter (Signed)
Reviewed labs dated 06/01/14: Bun 29, Cr 1.69.  Please call her, this is improved from last week.  I want her to increase aldactone to 100mg  twice daily.  Keep lasix at 40mg  once daily.  Needs cbc, bmet in 7 days, results faxed here.

## 2014-06-06 ENCOUNTER — Encounter: Payer: Self-pay | Admitting: Gastroenterology

## 2014-06-08 ENCOUNTER — Encounter (HOSPITAL_COMMUNITY): Payer: Self-pay | Admitting: *Deleted

## 2014-06-15 ENCOUNTER — Telehealth: Payer: Self-pay | Admitting: Gastroenterology

## 2014-06-15 DIAGNOSIS — R7989 Other specified abnormal findings of blood chemistry: Secondary | ICD-10-CM

## 2014-06-15 DIAGNOSIS — N289 Disorder of kidney and ureter, unspecified: Secondary | ICD-10-CM

## 2014-06-15 DIAGNOSIS — R799 Abnormal finding of blood chemistry, unspecified: Secondary | ICD-10-CM

## 2014-06-15 NOTE — Telephone Encounter (Signed)
Reviewed outside labs:  06/08/2014: Hb 9.7, Bun 30, Cr 2.1, K 4.2, Na 137.   Please call her.  She needs repeat BMET today; locally with results faxed immediately to me so I will be able to review later today or tomorrow.  Concerned about rising Cr; may need to decrease her diuretics but want another set of labs before adjusting

## 2014-06-15 NOTE — Telephone Encounter (Signed)
Pt aware and will stop meds and have repeat labs in 2 days.  Order has been faxed to local facility, as well as PCP

## 2014-06-15 NOTE — Telephone Encounter (Signed)
Labs reviewed from today:  Cr 2.91, K 4, Na 134   Please call her; the diuretics are causing significant kidney problems. She needs to stop them both (aldactone and lasix).  Needs repeat BMET locally in 2 days, the results should be faxed here and the results faxed to her PCP.  Please also send a copy of this note to her PCP so they are aware of her ARF (Dr. Loni Dolly).

## 2014-06-15 NOTE — Telephone Encounter (Signed)
Pt is aware and lab order has been faxed to Dr Loni Dolly

## 2014-06-16 ENCOUNTER — Ambulatory Visit: Payer: Medicare Other | Admitting: Gastroenterology

## 2014-06-19 ENCOUNTER — Encounter: Payer: Self-pay | Admitting: Gastroenterology

## 2014-06-24 ENCOUNTER — Encounter (HOSPITAL_COMMUNITY): Payer: Self-pay | Admitting: Anesthesiology

## 2014-06-24 ENCOUNTER — Telehealth: Payer: Self-pay

## 2014-06-24 NOTE — Telephone Encounter (Signed)
Dr Christella Hartigan the pt's daughter called to say that the pt is going to [facility] tonight per the PCP.  I have cx the procedure for tomorrow

## 2014-06-24 NOTE — Anesthesia Preprocedure Evaluation (Deleted)
Anesthesia Evaluation  Patient identified by MRN, date of birth, ID band Patient awake    Reviewed: Allergy & Precautions, NPO status , Patient's Chart, lab work & pertinent test results  History of Anesthesia Complications (+) DIFFICULT AIRWAY and history of anesthetic complications  Airway Mallampati: II  TM Distance: >3 FB Neck ROM: Full    Dental no notable dental hx. (+) Dental Advisory Given   Pulmonary neg pulmonary ROS,  breath sounds clear to auscultation  Pulmonary exam normal       Cardiovascular hypertension, Pt. on medications + dysrhythmias Rhythm:Regular Rate:Normal     Neuro/Psych negative neurological ROS  negative psych ROS   GI/Hepatic Neg liver ROS, GERD-  Medicated and Controlled,  Endo/Other  negative endocrine ROSdiabetes  Renal/GU Renal disease  negative genitourinary   Musculoskeletal  (+) Arthritis -,   Abdominal   Peds negative pediatric ROS (+)  Hematology  (+) anemia ,   Anesthesia Other Findings   Reproductive/Obstetrics negative OB ROS                             Anesthesia Physical Anesthesia Plan  ASA: II  Anesthesia Plan: MAC   Post-op Pain Management:    Induction: Intravenous  Airway Management Planned: Nasal Cannula  Additional Equipment:   Intra-op Plan:   Post-operative Plan:   Informed Consent: I have reviewed the patients History and Physical, chart, labs and discussed the procedure including the risks, benefits and alternatives for the proposed anesthesia with the patient or authorized representative who has indicated his/her understanding and acceptance.   Dental advisory given  Plan Discussed with: CRNA  Anesthesia Plan Comments:         Anesthesia Quick Evaluation

## 2014-06-25 ENCOUNTER — Ambulatory Visit (HOSPITAL_COMMUNITY): Admission: RE | Admit: 2014-06-25 | Payer: Medicare Other | Source: Ambulatory Visit | Admitting: Gastroenterology

## 2014-06-25 HISTORY — DX: Unspecified hearing loss, unspecified ear: H91.90

## 2014-06-25 HISTORY — DX: Personal history of other medical treatment: Z92.89

## 2014-06-25 HISTORY — DX: Localized edema: R60.0

## 2014-06-25 SURGERY — ESOPHAGOGASTRODUODENOSCOPY (EGD) WITH PROPOFOL
Anesthesia: Monitor Anesthesia Care

## 2014-06-25 NOTE — Telephone Encounter (Signed)
Ok, thanks.

## 2014-06-29 ENCOUNTER — Ambulatory Visit: Payer: Medicare Other | Admitting: Gastroenterology

## 2015-02-04 ENCOUNTER — Ambulatory Visit (INDEPENDENT_AMBULATORY_CARE_PROVIDER_SITE_OTHER): Payer: Medicare Other | Admitting: Otolaryngology

## 2015-02-04 DIAGNOSIS — R490 Dysphonia: Secondary | ICD-10-CM

## 2015-02-04 DIAGNOSIS — K219 Gastro-esophageal reflux disease without esophagitis: Secondary | ICD-10-CM

## 2015-03-04 ENCOUNTER — Ambulatory Visit (INDEPENDENT_AMBULATORY_CARE_PROVIDER_SITE_OTHER): Payer: Medicare Other | Admitting: Otolaryngology

## 2015-03-04 DIAGNOSIS — K219 Gastro-esophageal reflux disease without esophagitis: Secondary | ICD-10-CM

## 2015-03-04 DIAGNOSIS — R490 Dysphonia: Secondary | ICD-10-CM

## 2015-06-17 ENCOUNTER — Other Ambulatory Visit: Payer: Self-pay | Admitting: Gastroenterology

## 2016-05-24 IMAGING — US US ABDOMEN COMPLETE
1 series · 13 of 25 positions shown · non-contrast
Comparison: None.

CLINICAL DATA: Elevated liver function tests, cirrhosis

EXAM:
ULTRASOUND ABDOMEN COMPLETE

[Series 1: us abdomen complete · 0.25mm/px · 13 of 50 slices shown]
[im 1/50]
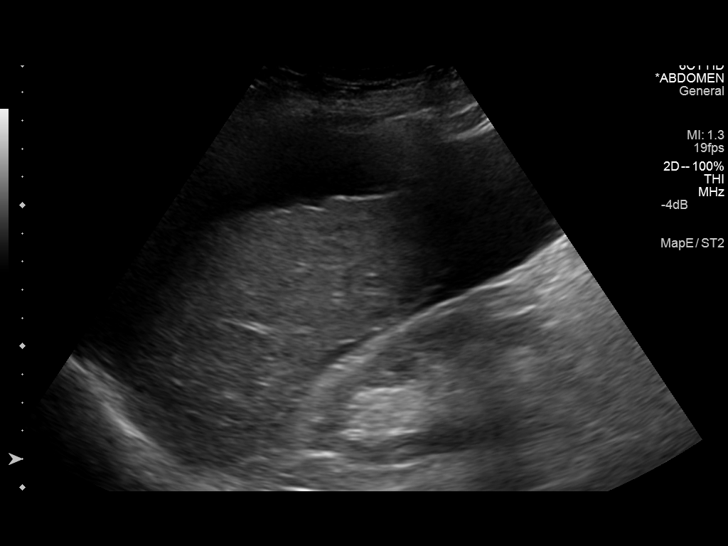
[im 5/50]
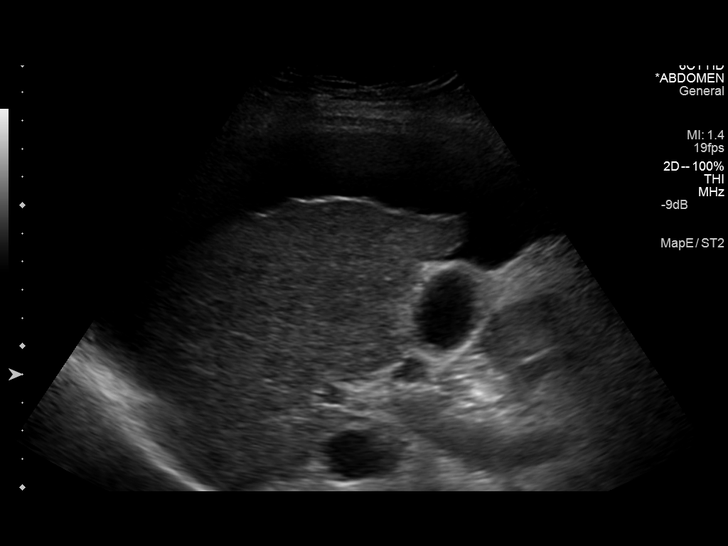
[im 9/50]
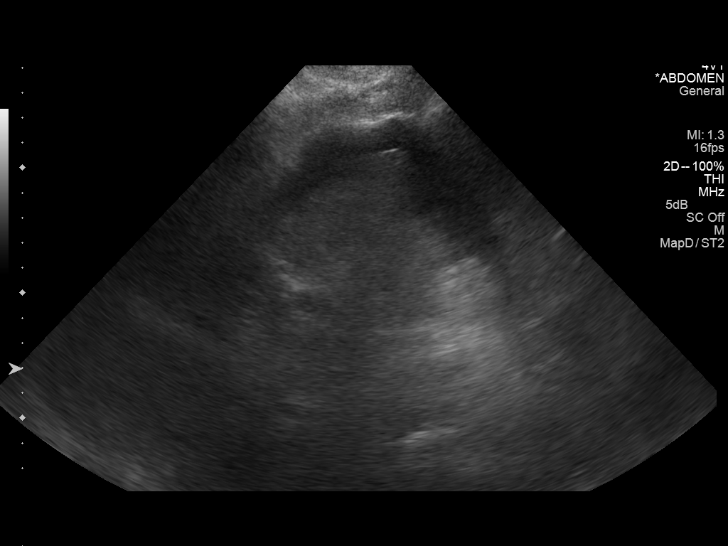
[im 13/50]
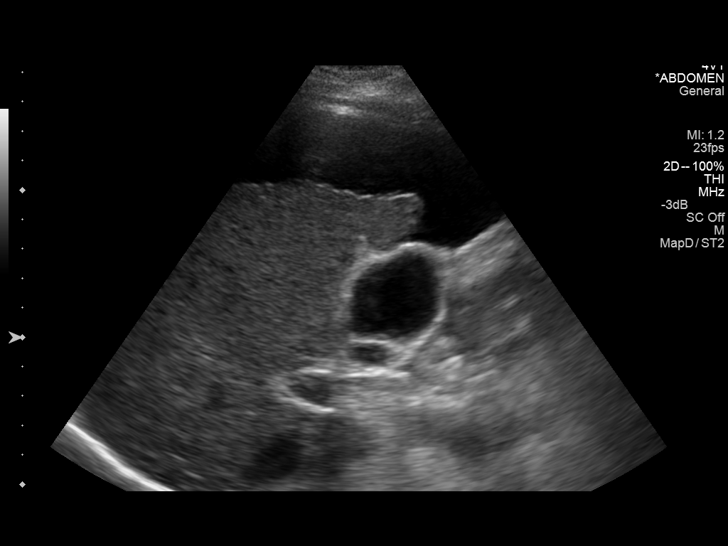
[im 17/50]
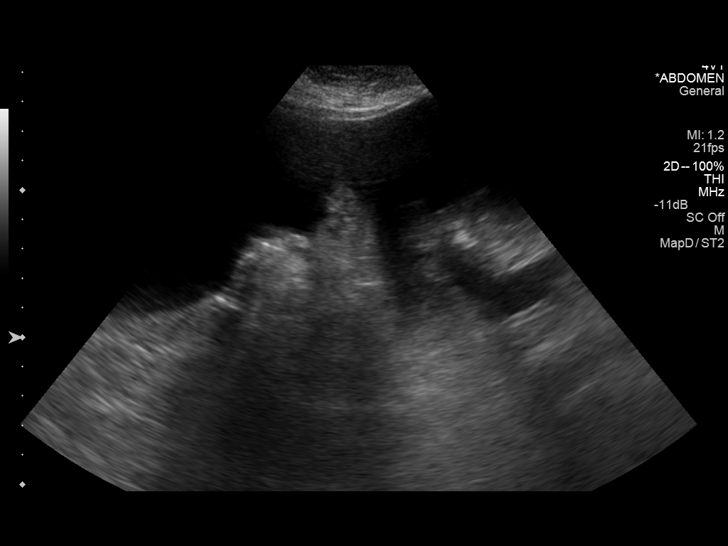
[im 21/50]
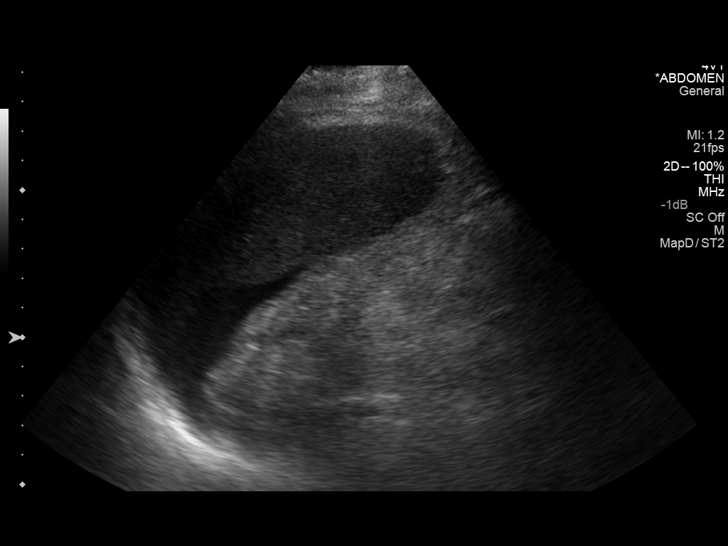
[im 25/50]
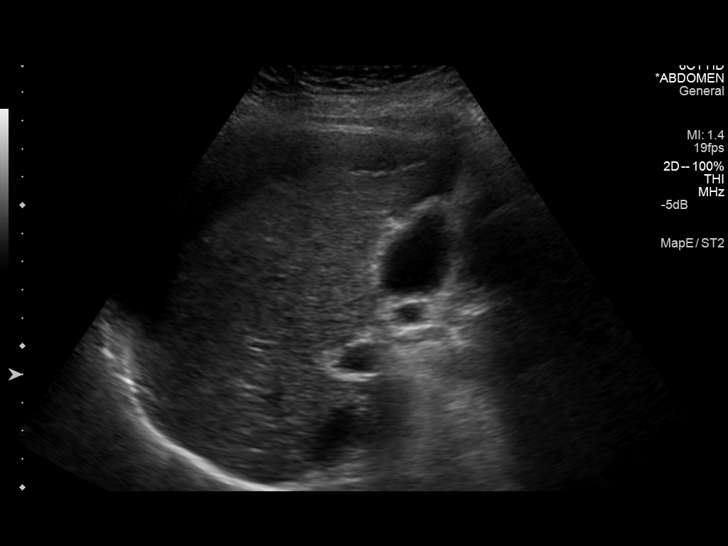
[im 29/50]
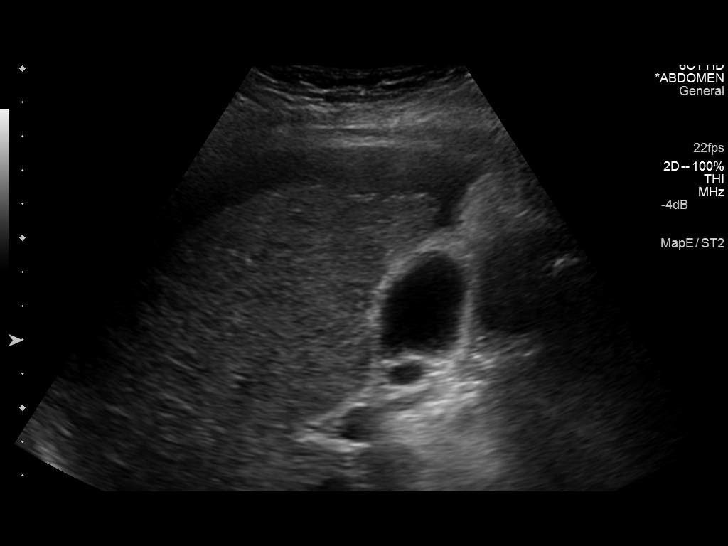
[im 33/50]
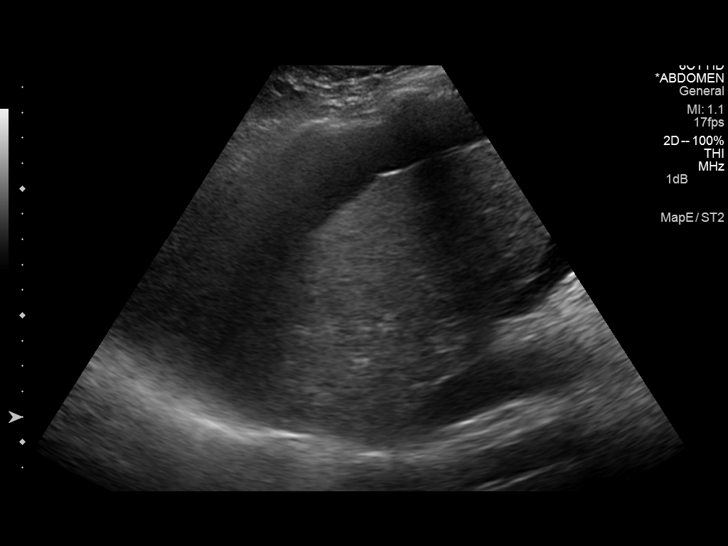
[im 37/50]
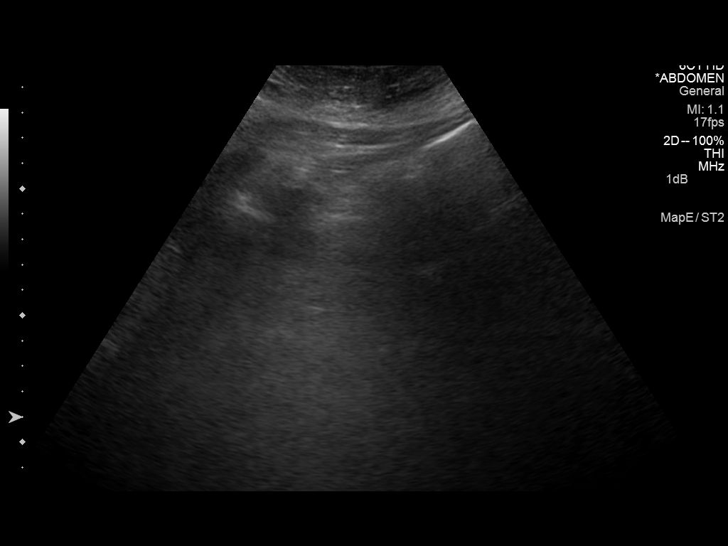
[im 41/50]
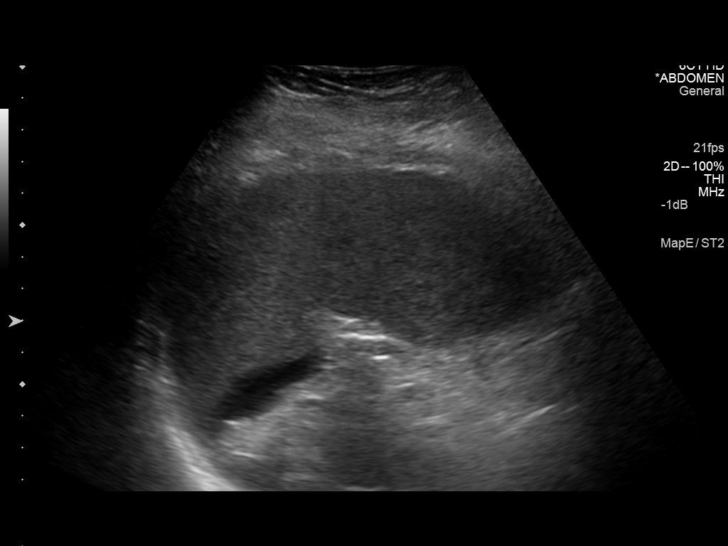
[im 45/50]
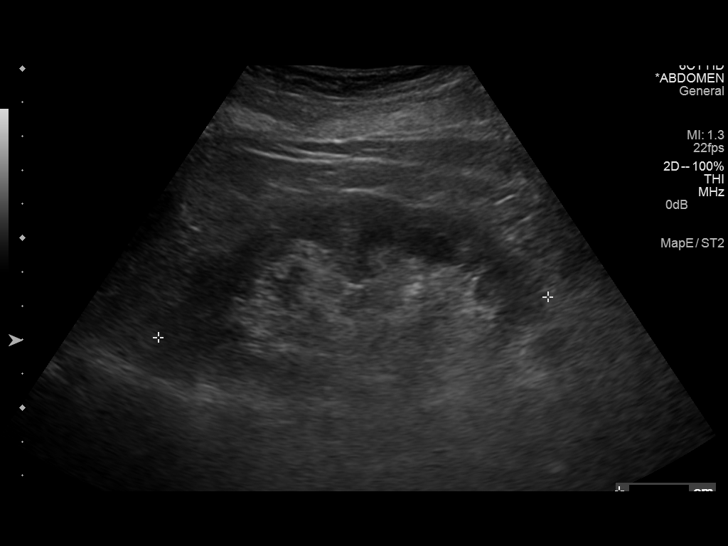
[im 50/50]
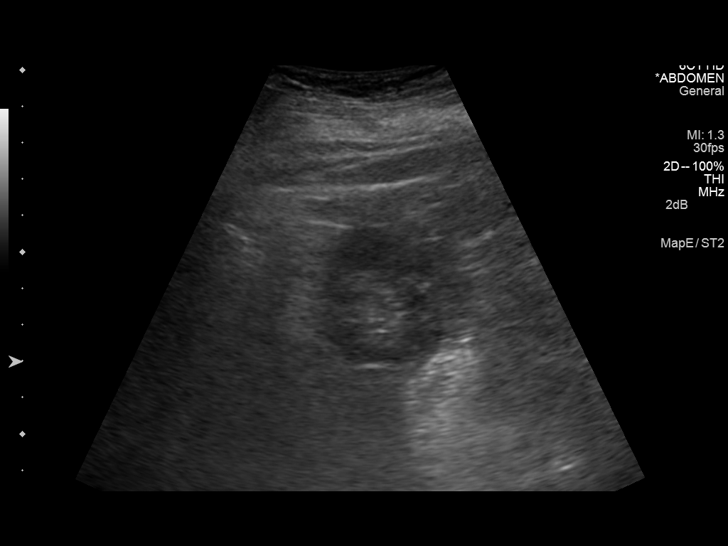

[13 of 25 positions shown; findings below may reference images not displayed]

FINDINGS: Gallbladder: No gallstones or wall thickening visualized. No
sonographic Murphy sign noted.

Common bile duct: Diameter: 3 mm

Liver: Nodular contour with increased echogenicity but no focal
mass. No intrahepatic ductal dilatation.

IVC: No abnormality visualized.

Pancreas: Obscured by bowel gas

Spleen: Borderline splenomegaly measuring 12.6 cm in maximal length,
volume 542 cubic cm.

Right Kidney: Length: 11.2 cm. Echogenicity within normal limits. No
mass or hydronephrosis visualized.

Left Kidney: Length: 11.5 cm. Echogenicity within normal limits. No
mass or hydronephrosis visualized.

Bilateral renal cortical thinning noted and increased echogenicity
likely indicating medical renal disease.

Abdominal aorta: No aneurysm visualized.

Other findings: Moderate ascites.
IMPRESSION: Nodular hepatic contour compatible with the provided history of
cirrhosis with evidence of ascites.

Allowing for decreased sensitivity and specificity of ultrasound for
detection of hepatoma as compared to more sensitive MRI abdomen, no
focal hepatic mass is identified.

## 2019-06-20 DEATH — deceased
